# Patient Record
Sex: Male | Born: 1985 | Race: White | Hispanic: No | Marital: Single | Smoking: Never smoker
Health system: Southern US, Community
[De-identification: ages and names within clinical notes are randomized; demographics above are authoritative.]

## PROBLEM LIST (undated history)

## (undated) DIAGNOSIS — N200 Calculus of kidney: Secondary | ICD-10-CM

## (undated) HISTORY — PX: STONE EXTRACTION WITH BASKET: SHX5318

---

## 2008-04-09 ENCOUNTER — Ambulatory Visit: Payer: Self-pay | Admitting: Family Medicine

## 2008-04-09 DIAGNOSIS — N200 Calculus of kidney: Secondary | ICD-10-CM | POA: Insufficient documentation

## 2008-04-09 DIAGNOSIS — Q638 Other specified congenital malformations of kidney: Secondary | ICD-10-CM | POA: Insufficient documentation

## 2008-04-16 ENCOUNTER — Telehealth (INDEPENDENT_AMBULATORY_CARE_PROVIDER_SITE_OTHER): Payer: Self-pay | Admitting: *Deleted

## 2008-04-16 ENCOUNTER — Ambulatory Visit (HOSPITAL_COMMUNITY): Admission: RE | Admit: 2008-04-16 | Discharge: 2008-04-16 | Payer: Self-pay | Admitting: Family Medicine

## 2008-04-16 ENCOUNTER — Ambulatory Visit: Payer: Self-pay | Admitting: Family Medicine

## 2008-04-29 ENCOUNTER — Encounter (INDEPENDENT_AMBULATORY_CARE_PROVIDER_SITE_OTHER): Payer: Self-pay | Admitting: Family Medicine

## 2008-05-03 ENCOUNTER — Encounter (INDEPENDENT_AMBULATORY_CARE_PROVIDER_SITE_OTHER): Payer: Self-pay | Admitting: Family Medicine

## 2008-12-14 ENCOUNTER — Emergency Department (HOSPITAL_COMMUNITY): Admission: EM | Admit: 2008-12-14 | Discharge: 2008-12-14 | Payer: Self-pay | Admitting: Emergency Medicine

## 2008-12-25 ENCOUNTER — Telehealth (INDEPENDENT_AMBULATORY_CARE_PROVIDER_SITE_OTHER): Payer: Self-pay | Admitting: *Deleted

## 2009-01-01 ENCOUNTER — Emergency Department (HOSPITAL_COMMUNITY): Admission: EM | Admit: 2009-01-01 | Discharge: 2009-01-01 | Payer: Self-pay | Admitting: Emergency Medicine

## 2009-01-09 ENCOUNTER — Ambulatory Visit: Payer: Self-pay | Admitting: Family Medicine

## 2009-01-09 DIAGNOSIS — R109 Unspecified abdominal pain: Secondary | ICD-10-CM

## 2009-01-22 ENCOUNTER — Ambulatory Visit: Payer: Self-pay | Admitting: Family Medicine

## 2011-12-10 ENCOUNTER — Emergency Department (HOSPITAL_COMMUNITY): Payer: BC Managed Care – PPO

## 2011-12-10 ENCOUNTER — Encounter (HOSPITAL_COMMUNITY): Payer: Self-pay

## 2011-12-10 ENCOUNTER — Emergency Department (HOSPITAL_COMMUNITY)
Admission: EM | Admit: 2011-12-10 | Discharge: 2011-12-10 | Disposition: A | Discharge status: Home / Self Care | Payer: BC Managed Care – PPO | Attending: Emergency Medicine | Admitting: Emergency Medicine

## 2011-12-10 DIAGNOSIS — M436 Torticollis: Secondary | ICD-10-CM | POA: Insufficient documentation

## 2011-12-10 HISTORY — DX: Calculus of kidney: N20.0

## 2011-12-10 MED ORDER — NAPROXEN 500 MG PO TABS: 500.0000 mg | ORAL_TABLET | Freq: Two times a day (BID) | ORAL | Status: AC

## 2011-12-10 MED ORDER — IBUPROFEN 800 MG PO TABS
800.0000 mg | ORAL_TABLET | Freq: Once | ORAL | Status: AC
  Administered 2011-12-10: 800 mg via ORAL
  Filled 2011-12-10: qty 1

## 2011-12-10 MED ORDER — CYCLOBENZAPRINE HCL 10 MG PO TABS: 10.0000 mg | ORAL_TABLET | Freq: Three times a day (TID) | ORAL | Status: AC | PRN

## 2011-12-10 MED ORDER — OXYCODONE-ACETAMINOPHEN 5-325 MG PO TABS
2.0000 | ORAL_TABLET | Freq: Once | ORAL | Status: AC
  Administered 2011-12-10: 2 via ORAL
  Filled 2011-12-10: qty 2

## 2011-12-10 MED ORDER — OXYCODONE-ACETAMINOPHEN 5-325 MG PO TABS: 1.0000 | ORAL_TABLET | Freq: Four times a day (QID) | ORAL | Status: AC | PRN

## 2011-12-10 NOTE — ED Notes (Signed)
Pt presents with neck pain that began last pm. C/o sharp pain that radiates down back.

## 2011-12-10 NOTE — ED Provider Notes (Signed)
History     CSN: [id number]  Arrival date & time 12/10/11  1637   First MD Initiated Contact with Patient 12/10/11 1703      Chief Complaint  Patient presents with  . Neck Pain    (Consider location/radiation/quality/duration/timing/severity/associated sxs/prior treatment) Patient is a 26 y.o. male presenting with neck pain. The history is provided by the patient. No language interpreter was used.  Neck Pain  This is a new problem. The current episode started yesterday. The problem occurs constantly. The problem has been gradually worsening. The pain is associated with nothing. There has been no fever. The pain is present in the generalized neck. The quality of the pain is described as aching. The pain radiates to the left shoulder and right shoulder. The pain is moderate. The symptoms are aggravated by bending and position. The pain is the same all the time. Pertinent negatives include no photophobia, no visual change, no chest pain, no syncope, no numbness, no weight loss, no headaches, no bowel incontinence, no bladder incontinence, no paresis and no weakness. He has tried nothing for the symptoms.    Past Medical History  Diagnosis Date  . Kidney stones     Past Surgical History  Procedure Date  . Stone extraction with basket     No family history on file.  History  Substance Use Topics  . Smoking status: Never Smoker   . Smokeless tobacco: Not on file  . Alcohol Use: Yes      Review of Systems  Constitutional: Negative for fever, weight loss, activity change, appetite change and fatigue.  HENT: Positive for neck pain. Negative for congestion, sore throat, rhinorrhea and neck stiffness.   Eyes: Negative for photophobia.  Respiratory: Negative for cough and shortness of breath.   Cardiovascular: Negative for chest pain, palpitations and syncope.  Gastrointestinal: Negative for nausea, vomiting, abdominal pain and bowel incontinence.  Genitourinary: Negative for  bladder incontinence, dysuria, urgency, frequency and flank pain.  Musculoskeletal: Negative for myalgias, back pain and arthralgias.  Neurological: Negative for dizziness, weakness, light-headedness, numbness and headaches.  All other systems reviewed and are negative.    Allergies  Review of patient's allergies indicates no known allergies.  Home Medications   Current Outpatient Rx  Name Route Sig Dispense Refill  . ACETAMINOPHEN 500 MG PO TABS Oral Take 1,000 mg by mouth once as needed.    . MUSCLE RUB 10-15 % EX CREA Topical Apply 1 application topically as needed.    . CYCLOBENZAPRINE HCL 10 MG PO TABS Oral Take 1 tablet (10 mg total) by mouth 3 (three) times daily as needed for muscle spasms. 30 tablet 0  . NAPROXEN 500 MG PO TABS Oral Take 1 tablet (500 mg total) by mouth 2 (two) times daily. 30 tablet 0  . OXYCODONE-ACETAMINOPHEN 5-325 MG PO TABS Oral Take 1-2 tablets by mouth every 6 (six) hours as needed for pain. 20 tablet 0    BP 149/79  Pulse 93  Temp(Src) 99 F (37.2 C) (Oral)  Resp 20  Ht 6\' 3"  (1.905 m)  Wt 190 lb (86.183 kg)  BMI 23.75 kg/m2  SpO2 100%  Physical Exam  Nursing note and vitals reviewed. Constitutional: He is oriented to person, place, and time. He appears well-developed and well-nourished. No distress.  HENT:  Head: Normocephalic and atraumatic.  Mouth/Throat: Oropharynx is clear and moist.  Eyes: Conjunctivae and EOM are normal. Pupils are equal, round, and reactive to light.  Neck: Neck supple.  Limited range of motion secondary to pain. He has no midline tenderness to palpation. Pain is in the paraspinal and lateral musculature.  Cardiovascular: Normal rate, regular rhythm, normal heart sounds and intact distal pulses.  Exam reveals no gallop and no friction rub.   No murmur heard. Pulmonary/Chest: Effort normal and breath sounds normal. No respiratory distress.  Abdominal: Soft. Bowel sounds are normal. There is no tenderness.    Musculoskeletal: Normal range of motion. He exhibits no tenderness.  Lymphadenopathy:    He has no cervical adenopathy.  Neurological: He is alert and oriented to person, place, and time. He has normal strength and normal reflexes. No cranial nerve deficit or sensory deficit.  Skin: Skin is warm and dry. No rash noted.    ED Course  Procedures (including critical care time)  Labs Reviewed - No data to display Dg Cervical Spine Complete  12/10/2011  *RADIOLOGY REPORT*  Clinical Data: Neck pain.  CERVICAL SPINE - COMPLETE 4+ VIEW  Comparison: No priors.  Findings: No acute displaced fractures of the cervical spine are noted.  Alignment is anatomic.  Prevertebral soft tissues are normal.  No significant degenerative changes are appreciated.  IMPRESSION: 1.  No acute radiographic abnormality of the cervical spine to account for patient's symptoms.  Original Report Authenticated By: Florencia Reasons, M.D.     1. Torticollis       MDM  Torticollis. Encouraged ice and heat at home. Will be placed on anti-inflammatory, pain medication, muscle relaxant. Instructed to followup with his primary care physician. Provided strict signs and symptoms for which to return        Dayton Bailiff, MD 12/10/11 1758

## 2011-12-10 NOTE — ED Notes (Signed)
MD at bedside. 

## 2011-12-10 NOTE — Discharge Instructions (Signed)
Torticollis, Acute You have suddenly (acutely) developed a twisted neck (torticollis). This is usually a self-limited condition. CAUSES  Acute torticollis may be caused by malposition, trauma or infection. Most commonly, acute torticollis is caused by sleeping in an awkward position. Torticollis may also be caused by the flexion, extension or twisting of the neck muscles beyond their normal position. Sometimes, the exact cause may not be known. SYMPTOMS  Usually, there is pain and limited movement of the neck. Your neck may twist to one side. DIAGNOSIS  The diagnosis is often made by physical examination. X-rays, CT scans or MRIs may be done if there is a history of trauma or concern of infection. TREATMENT  For a common, stiff neck that develops during sleep, treatment is focused on relaxing the contracted neck muscle. Medications (including shots) may be used to treat the problem. Most cases resolve in several days. Torticollis usually responds to conservative physical therapy. If left untreated, the shortened and spastic neck muscle can cause deformities in the face and neck. Rarely, surgery is required. HOME CARE INSTRUCTIONS   Use over-the-counter and prescription medications as directed by your caregiver.   Do stretching exercises and massage the neck as directed by your caregiver.   Follow up with physical therapy if needed and as directed by your caregiver.  SEEK IMMEDIATE MEDICAL CARE IF:   You develop difficulty breathing or noisy breathing (stridor).   You drool, develop trouble swallowing or have pain with swallowing.   You develop numbness or weakness in the hands or feet.   You have changes in speech or vision.   You have problems with urination or bowel movements.   You have difficulty walking.   You have a fever.   You have increased pain.  MAKE SURE YOU:   Understand these instructions.   Will watch your condition.   Will get help right away if you are not  doing well or get worse.  Document Released: 08/27/2000 Document Revised: 08/19/2011 Document Reviewed: 10/08/2009 [employer] Patient Information 2012 [employer]. 

## 2013-04-25 ENCOUNTER — Other Ambulatory Visit: Payer: Self-pay | Admitting: Neurosurgery

## 2013-04-25 DIAGNOSIS — M47812 Spondylosis without myelopathy or radiculopathy, cervical region: Secondary | ICD-10-CM

## 2013-04-28 ENCOUNTER — Emergency Department (HOSPITAL_COMMUNITY)
Admission: EM | Admit: 2013-04-28 | Discharge: 2013-04-28 | Disposition: A | Discharge status: Home / Self Care | Payer: BC Managed Care – PPO | Attending: Emergency Medicine | Admitting: Emergency Medicine

## 2013-04-28 ENCOUNTER — Emergency Department (HOSPITAL_COMMUNITY): Payer: BC Managed Care – PPO

## 2013-04-28 ENCOUNTER — Encounter (HOSPITAL_COMMUNITY): Payer: Self-pay | Admitting: *Deleted

## 2013-04-28 DIAGNOSIS — Y939 Activity, unspecified: Secondary | ICD-10-CM | POA: Insufficient documentation

## 2013-04-28 DIAGNOSIS — S301XXA Contusion of abdominal wall, initial encounter: Secondary | ICD-10-CM

## 2013-04-28 DIAGNOSIS — W1789XA Other fall from one level to another, initial encounter: Secondary | ICD-10-CM | POA: Insufficient documentation

## 2013-04-28 DIAGNOSIS — S199XXA Unspecified injury of neck, initial encounter: Secondary | ICD-10-CM

## 2013-04-28 DIAGNOSIS — Y929 Unspecified place or not applicable: Secondary | ICD-10-CM | POA: Insufficient documentation

## 2013-04-28 DIAGNOSIS — S0990XA Unspecified injury of head, initial encounter: Secondary | ICD-10-CM | POA: Insufficient documentation

## 2013-04-28 DIAGNOSIS — Z87442 Personal history of urinary calculi: Secondary | ICD-10-CM | POA: Insufficient documentation

## 2013-04-28 DIAGNOSIS — S0993XA Unspecified injury of face, initial encounter: Secondary | ICD-10-CM | POA: Insufficient documentation

## 2013-04-28 LAB — URINALYSIS, ROUTINE W REFLEX MICROSCOPIC
Hgb urine dipstick: NEGATIVE
Leukocytes, UA: NEGATIVE
Nitrite: NEGATIVE
Protein, ur: NEGATIVE mg/dL
Specific Gravity, Urine: 1.02 (ref 1.005–1.030)
Urobilinogen, UA: 0.2 mg/dL (ref 0.0–1.0)

## 2013-04-28 MED ORDER — IOHEXOL 300 MG/ML  SOLN
100.0000 mL | Freq: Once | INTRAMUSCULAR | Status: AC | PRN
  Administered 2013-04-28: 100 mL via INTRAVENOUS

## 2013-04-28 MED ORDER — HYDROMORPHONE HCL PF 1 MG/ML IJ SOLN
INTRAMUSCULAR | Status: AC
  Administered 2013-04-28: 1 mg
  Filled 2013-04-28: qty 1

## 2013-04-28 MED ORDER — OXYCODONE-ACETAMINOPHEN 5-325 MG PO TABS: 1.0000 | ORAL_TABLET | Freq: Four times a day (QID) | ORAL | Status: DC | PRN
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
## 2013-04-28 NOTE — ED Notes (Signed)
Abrasions noted to left upper back and flank area

## 2013-04-29 ENCOUNTER — Ambulatory Visit
Admission: RE | Admit: 2013-04-29 | Discharge: 2013-04-29 | Disposition: A | Discharge status: Home / Self Care | Payer: BC Managed Care – PPO | Source: Ambulatory Visit | Attending: Neurosurgery | Admitting: Neurosurgery

## 2013-04-29 DIAGNOSIS — M47812 Spondylosis without myelopathy or radiculopathy, cervical region: Secondary | ICD-10-CM

## 2013-07-19 ENCOUNTER — Other Ambulatory Visit: Payer: Self-pay

## 2016-05-12 ENCOUNTER — Other Ambulatory Visit: Payer: Self-pay | Admitting: Nurse Practitioner

## 2016-05-12 DIAGNOSIS — M5020 Other cervical disc displacement, unspecified cervical region: Secondary | ICD-10-CM

## 2016-05-12 DIAGNOSIS — M47812 Spondylosis without myelopathy or radiculopathy, cervical region: Secondary | ICD-10-CM

## 2016-05-22 ENCOUNTER — Ambulatory Visit
Admission: RE | Admit: 2016-05-22 | Discharge: 2016-05-22 | Disposition: A | Discharge status: Home / Self Care | Payer: Self-pay | Source: Ambulatory Visit | Attending: Nurse Practitioner | Admitting: Nurse Practitioner

## 2016-05-22 DIAGNOSIS — M5020 Other cervical disc displacement, unspecified cervical region: Secondary | ICD-10-CM

## 2016-05-22 DIAGNOSIS — M47812 Spondylosis without myelopathy or radiculopathy, cervical region: Secondary | ICD-10-CM

## 2016-06-10 ENCOUNTER — Other Ambulatory Visit: Payer: Self-pay | Admitting: Nurse Practitioner

## 2016-06-10 DIAGNOSIS — M5020 Other cervical disc displacement, unspecified cervical region: Secondary | ICD-10-CM

## 2016-06-10 DIAGNOSIS — M47812 Spondylosis without myelopathy or radiculopathy, cervical region: Secondary | ICD-10-CM

## 2016-07-08 ENCOUNTER — Ambulatory Visit
Admission: RE | Admit: 2016-07-08 | Discharge: 2016-07-08 | Disposition: A | Discharge status: Home / Self Care | Payer: BLUE CROSS/BLUE SHIELD | Source: Ambulatory Visit | Attending: Nurse Practitioner | Admitting: Nurse Practitioner

## 2016-07-08 DIAGNOSIS — M5020 Other cervical disc displacement, unspecified cervical region: Secondary | ICD-10-CM

## 2016-07-08 DIAGNOSIS — M47812 Spondylosis without myelopathy or radiculopathy, cervical region: Secondary | ICD-10-CM

## 2016-07-08 MED ORDER — IOPAMIDOL (ISOVUE-M 300) INJECTION 61%
1.0000 mL | Freq: Once | INTRAMUSCULAR | Status: AC | PRN
  Administered 2016-07-08: 1 mL via EPIDURAL

## 2016-07-08 MED ORDER — TRIAMCINOLONE ACETONIDE 40 MG/ML IJ SUSP (RADIOLOGY)
60.0000 mg | Freq: Once | INTRAMUSCULAR | Status: AC
  Administered 2016-07-08: 60 mg via EPIDURAL

## 2016-07-08 NOTE — Discharge Instructions (Signed)

## 2016-07-09 ENCOUNTER — Inpatient Hospital Stay: Admission: RE | Admit: 2016-07-09 | Payer: Self-pay | Source: Ambulatory Visit

## 2016-08-17 ENCOUNTER — Other Ambulatory Visit: Payer: Self-pay | Admitting: Nurse Practitioner

## 2016-08-17 DIAGNOSIS — M5020 Other cervical disc displacement, unspecified cervical region: Secondary | ICD-10-CM

## 2016-09-24 ENCOUNTER — Ambulatory Visit
Admission: RE | Admit: 2016-09-24 | Discharge: 2016-09-24 | Disposition: A | Discharge status: Home / Self Care | Payer: BLUE CROSS/BLUE SHIELD | Source: Ambulatory Visit | Attending: Nurse Practitioner | Admitting: Nurse Practitioner

## 2016-09-24 DIAGNOSIS — M5020 Other cervical disc displacement, unspecified cervical region: Secondary | ICD-10-CM

## 2016-09-24 MED ORDER — TRIAMCINOLONE ACETONIDE 40 MG/ML IJ SUSP (RADIOLOGY)
60.0000 mg | Freq: Once | INTRAMUSCULAR | Status: AC
  Administered 2016-09-24: 60 mg via EPIDURAL

## 2016-09-24 MED ORDER — HYDROCODONE-ACETAMINOPHEN 5-325 MG PO TABS
2.0000 | ORAL_TABLET | Freq: Once | ORAL | Status: AC
  Administered 2016-09-24: 2 via ORAL

## 2016-09-24 MED ORDER — IOPAMIDOL (ISOVUE-M 300) INJECTION 61%
1.0000 mL | Freq: Once | INTRAMUSCULAR | Status: AC | PRN
Start: 2016-09-24 — End: 2016-09-24
  Administered 2016-09-24: 1 mL via EPIDURAL

## 2016-09-24 NOTE — Discharge Instructions (Signed)

## 2017-02-01 ENCOUNTER — Other Ambulatory Visit: Payer: Self-pay | Admitting: Nurse Practitioner

## 2017-02-01 DIAGNOSIS — M545 Low back pain, unspecified: Secondary | ICD-10-CM

## 2017-02-19 ENCOUNTER — Ambulatory Visit
Admission: RE | Admit: 2017-02-19 | Discharge: 2017-02-19 | Disposition: A | Discharge status: Home / Self Care | Payer: BLUE CROSS/BLUE SHIELD | Source: Ambulatory Visit | Attending: Nurse Practitioner | Admitting: Nurse Practitioner

## 2017-02-19 DIAGNOSIS — M545 Low back pain, unspecified: Secondary | ICD-10-CM

## 2017-02-25 ENCOUNTER — Other Ambulatory Visit: Payer: Self-pay | Admitting: Nurse Practitioner

## 2017-02-25 DIAGNOSIS — M5126 Other intervertebral disc displacement, lumbar region: Secondary | ICD-10-CM

## 2017-03-04 ENCOUNTER — Inpatient Hospital Stay
Admission: RE | Admit: 2017-03-04 | Discharge: 2017-03-04 | Disposition: A | Discharge status: Home / Self Care | Payer: PRIVATE HEALTH INSURANCE | Source: Ambulatory Visit | Attending: Nurse Practitioner | Admitting: Nurse Practitioner

## 2017-04-19 ENCOUNTER — Ambulatory Visit
Admission: RE | Admit: 2017-04-19 | Discharge: 2017-04-19 | Disposition: A | Discharge status: Home / Self Care | Payer: PRIVATE HEALTH INSURANCE | Source: Ambulatory Visit | Attending: Nurse Practitioner | Admitting: Nurse Practitioner

## 2017-04-19 DIAGNOSIS — M5126 Other intervertebral disc displacement, lumbar region: Secondary | ICD-10-CM

## 2017-04-19 MED ORDER — IOPAMIDOL (ISOVUE-M 200) INJECTION 41%
1.0000 mL | Freq: Once | INTRAMUSCULAR | Status: AC
  Administered 2017-04-19: 1 mL via EPIDURAL

## 2017-04-19 MED ORDER — METHYLPREDNISOLONE ACETATE 40 MG/ML INJ SUSP (RADIOLOG
120.0000 mg | Freq: Once | INTRAMUSCULAR | Status: AC
  Administered 2017-04-19: 120 mg via EPIDURAL

## 2017-04-19 NOTE — Discharge Instructions (Signed)

## 2017-07-28 ENCOUNTER — Other Ambulatory Visit: Payer: Self-pay | Admitting: Neurosurgery

## 2017-07-28 DIAGNOSIS — M5020 Other cervical disc displacement, unspecified cervical region: Secondary | ICD-10-CM

## 2017-08-07 ENCOUNTER — Ambulatory Visit
Admission: RE | Admit: 2017-08-07 | Discharge: 2017-08-07 | Disposition: A | Discharge status: Home / Self Care | Payer: PRIVATE HEALTH INSURANCE | Source: Ambulatory Visit | Attending: Neurosurgery | Admitting: Neurosurgery

## 2017-08-07 DIAGNOSIS — M5020 Other cervical disc displacement, unspecified cervical region: Secondary | ICD-10-CM

## 2017-08-15 ENCOUNTER — Other Ambulatory Visit: Payer: Self-pay | Admitting: Neurosurgery

## 2017-08-15 DIAGNOSIS — M5020 Other cervical disc displacement, unspecified cervical region: Secondary | ICD-10-CM

## 2017-08-25 ENCOUNTER — Inpatient Hospital Stay: Admission: RE | Admit: 2017-08-25 | Payer: PRIVATE HEALTH INSURANCE | Source: Ambulatory Visit

## 2017-09-08 ENCOUNTER — Ambulatory Visit
Admission: RE | Admit: 2017-09-08 | Discharge: 2017-09-08 | Disposition: A | Discharge status: Home / Self Care | Payer: PRIVATE HEALTH INSURANCE | Source: Ambulatory Visit | Attending: Neurosurgery | Admitting: Neurosurgery

## 2017-09-08 DIAGNOSIS — M5020 Other cervical disc displacement, unspecified cervical region: Secondary | ICD-10-CM

## 2017-09-08 MED ORDER — HYDROCODONE-ACETAMINOPHEN 5-325 MG PO TABS
2.0000 | ORAL_TABLET | Freq: Once | ORAL | Status: AC
  Administered 2017-09-08: 2 via ORAL

## 2017-09-08 MED ORDER — IOPAMIDOL (ISOVUE-M 300) INJECTION 61%
1.0000 mL | Freq: Once | INTRAMUSCULAR | Status: AC | PRN
  Administered 2017-09-08: 1 mL via EPIDURAL

## 2017-09-08 MED ORDER — TRIAMCINOLONE ACETONIDE 40 MG/ML IJ SUSP (RADIOLOGY)
60.0000 mg | Freq: Once | INTRAMUSCULAR | Status: AC
  Administered 2017-09-08: 60 mg via EPIDURAL

## 2018-01-08 IMAGING — MR MR CERVICAL SPINE W/O CM
5 series · 28 of 48 positions shown · non-contrast
Comparison: 04/29/2013

CLINICAL DATA: Neck pain for 3 years. Getting increasingly worse
over the past few months. Radiates down bilateral arms.

EXAM:
MRI CERVICAL SPINE WITHOUT CONTRAST
TECHNIQUE: Multiplanar, multisequence MR imaging of the cervical spine was
performed. No intravenous contrast was administered.

[Series 6: T1 · sagittal · 3.0mm · 0.66mm/px · 6 of 15 slices shown]
[im 1/15]
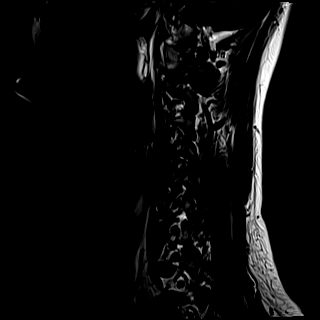
[im 3/15]
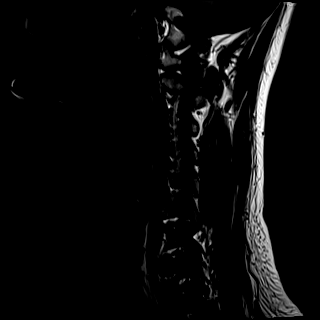
[im 6/15]
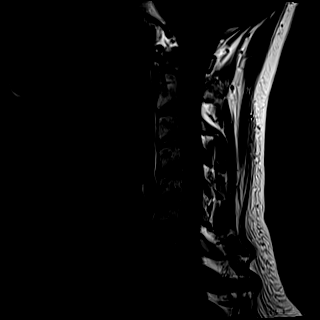
[im 9/15]
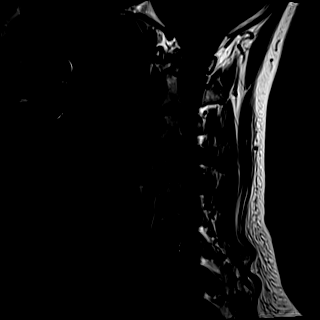
[im 12/15]
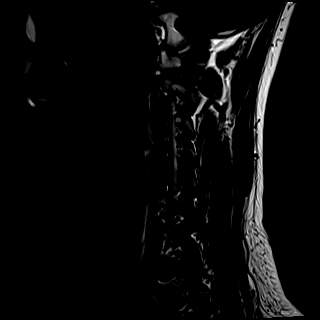
[im 15/15]
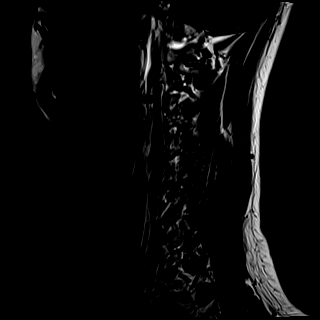

[Series 7: T2 · sagittal · 3.0mm · 0.55mm/px · 6 of 15 slices shown (1 of 2)]
[im 1/15]
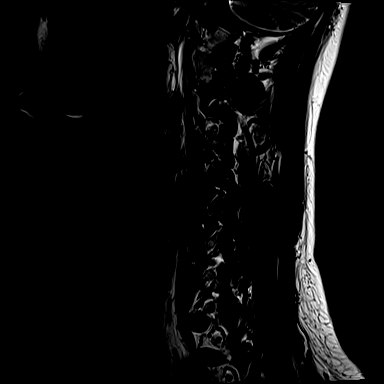
[im 3/15]
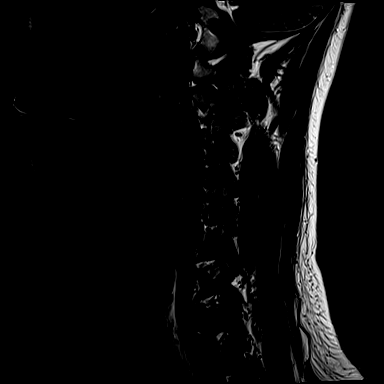
[im 6/15]
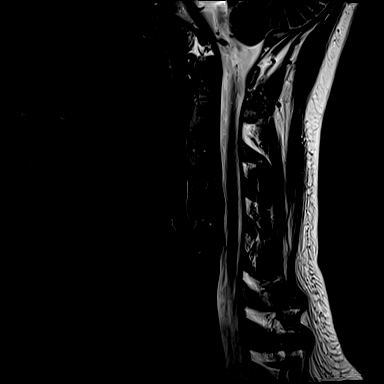
[im 9/15]
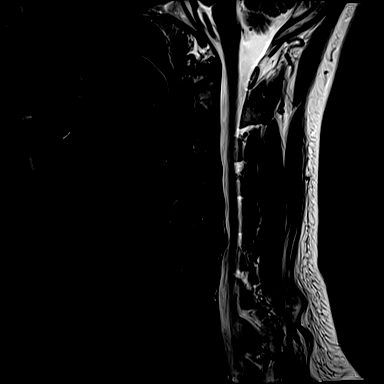
[im 12/15]
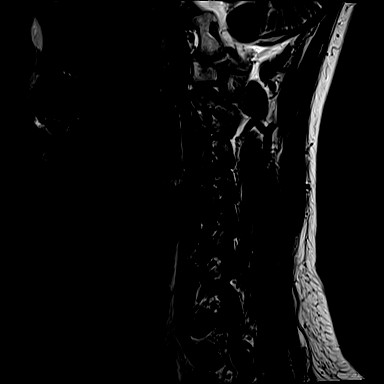
[im 15/15]
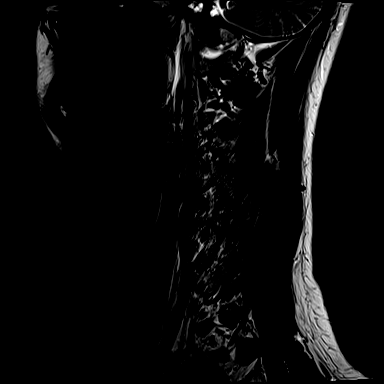

[Series 8: T2 · axial · 3.0mm · 0.50mm/px · z∈[-81,+36]mm · 9 of 37 slices shown (2 of 2)]
[im 1/37]
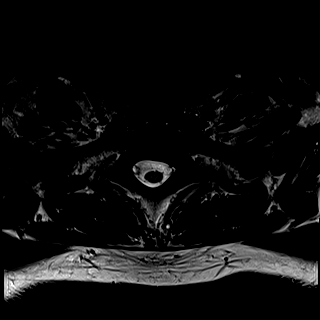
[im 6/37]
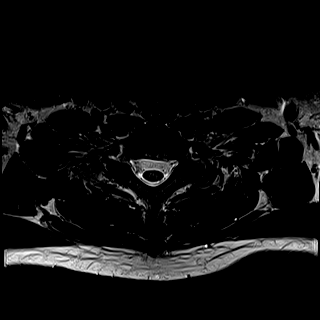
[im 11/37]
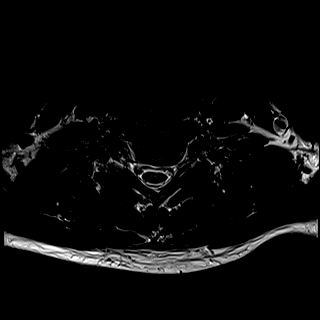
[im 16/37]
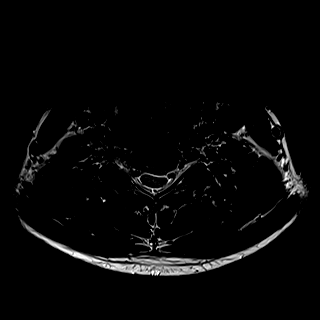
[im 19/37]
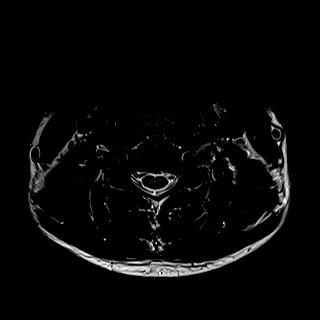
[im 21/37]
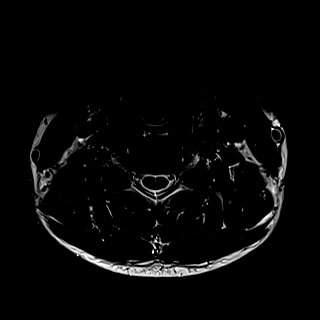
[im 26/37]
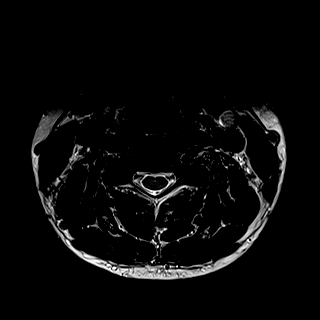
[im 31/37]
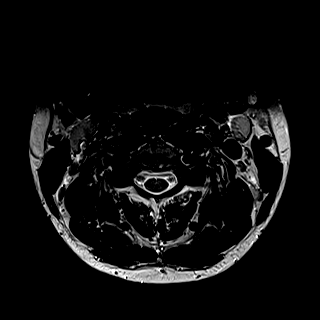
[im 37/37]
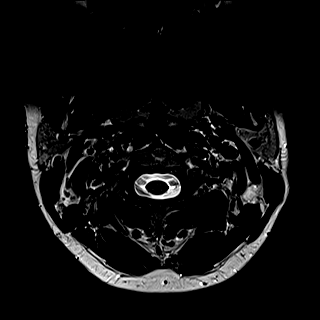

[Series 9: STIR · sagittal · 3.0mm · 0.33mm/px · 6 of 15 slices shown]
[im 1/15]
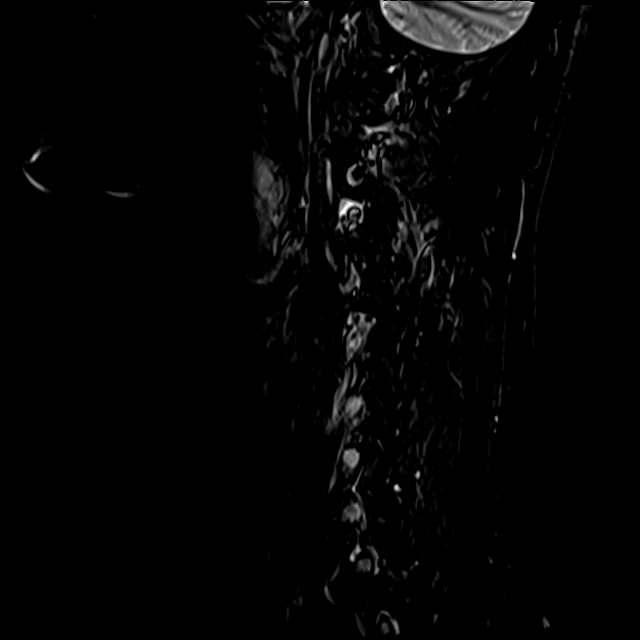
[im 3/15]
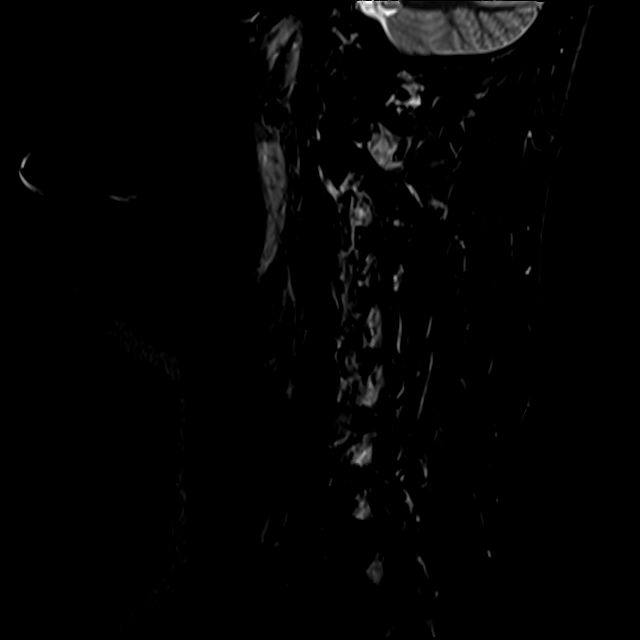
[im 6/15]
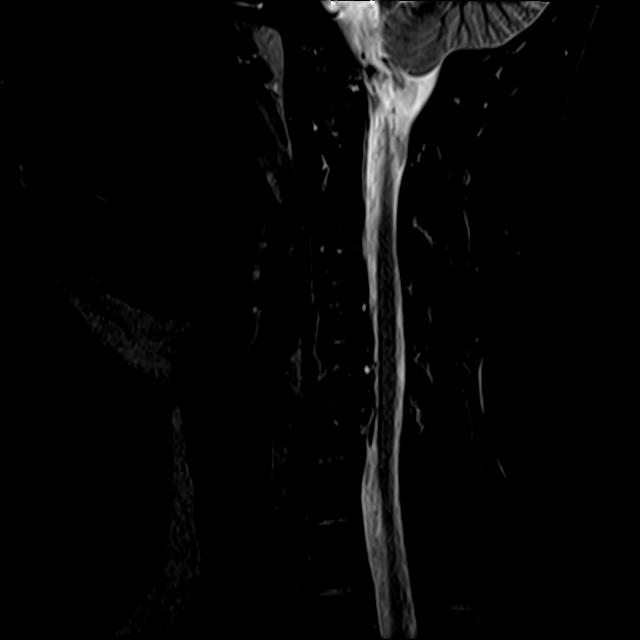
[im 9/15]
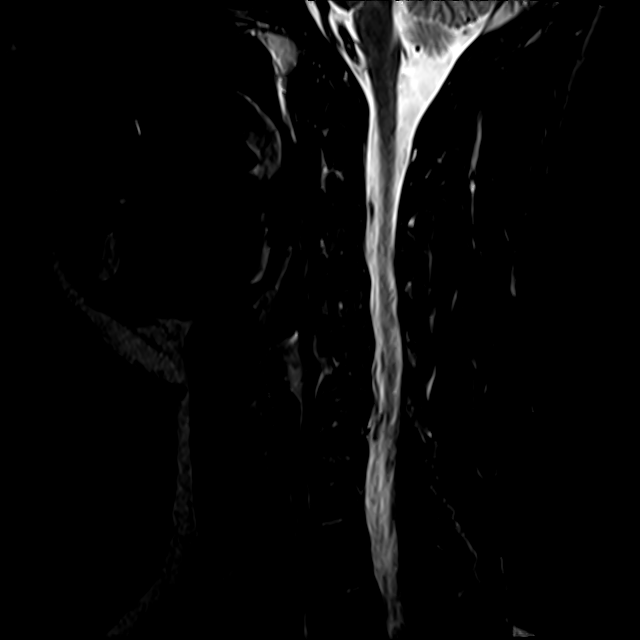
[im 12/15]
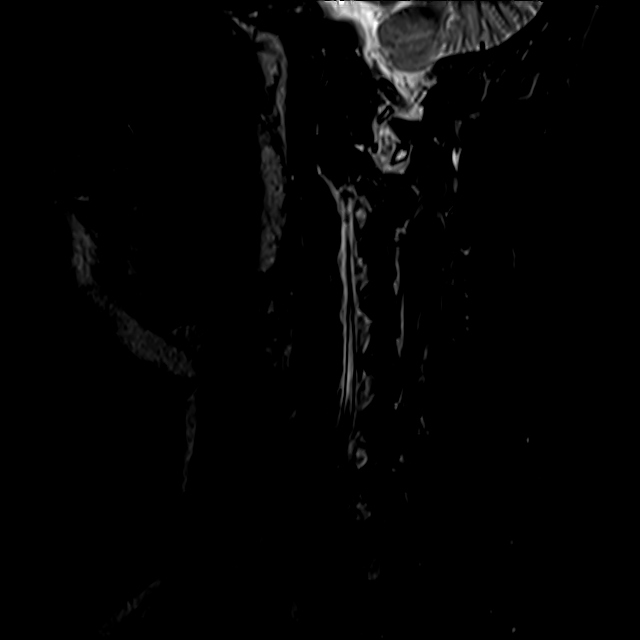
[im 15/15]
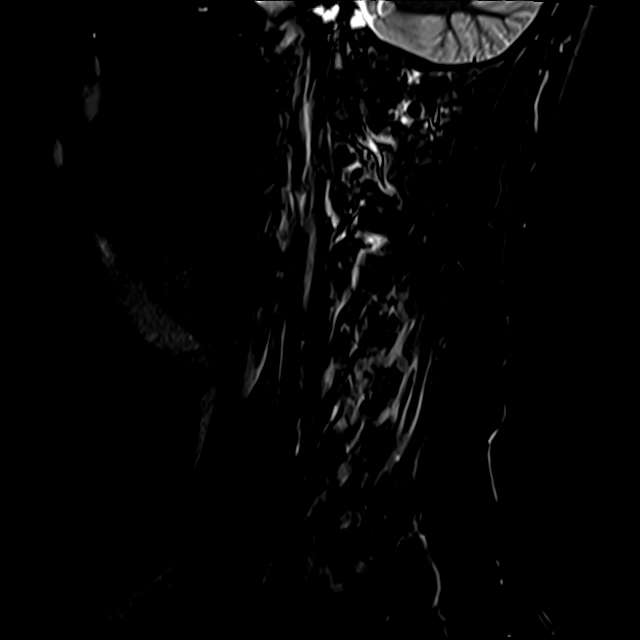

[Series 10: GRE · axial · 3.0mm · 0.42mm/px · 1 of 37 slices shown]
[im 1/37]
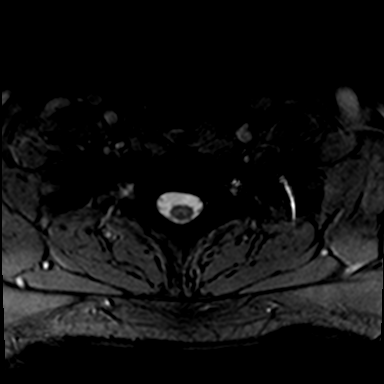

[28 of 48 positions shown; findings below may reference images not displayed]

FINDINGS: Alignment: Physiologic.

Vertebrae: No fracture, evidence of discitis, or bone lesion.

Cord: Normal signal and morphology.

Posterior Fossa, vertebral arteries, paraspinal tissues: Negative.

Disc levels:

Discs: Mild disc desiccation at C5-6.

C2-3: No significant disc bulge. No neural foraminal stenosis. No
central canal stenosis.

C3-4: No significant disc bulge. No neural foraminal stenosis. No
central canal stenosis.

C4-5: No significant disc bulge. No neural foraminal stenosis. No
central canal stenosis.

C5-6: Left paracentral disc protrusion contacting the ventral right
paracentral cervical spinal cord. No neural foraminal stenosis. No
central canal stenosis.

C6-7: No significant disc bulge. No neural foraminal stenosis. No
central canal stenosis.

C7-T1: No significant disc bulge. No neural foraminal stenosis. No
central canal stenosis.
IMPRESSION: 1. At C5-6 there is a left paracentral disc protrusion contacting
the ventral right paracentral cervical spinal cord, which is larger
compared with the prior exam of 04/29/2013.

## 2018-04-29 ENCOUNTER — Other Ambulatory Visit: Payer: Self-pay | Admitting: Nurse Practitioner

## 2018-04-29 DIAGNOSIS — M5020 Other cervical disc displacement, unspecified cervical region: Secondary | ICD-10-CM

## 2018-05-14 ENCOUNTER — Ambulatory Visit
Admission: RE | Admit: 2018-05-14 | Discharge: 2018-05-14 | Disposition: A | Discharge status: Home / Self Care | Payer: PRIVATE HEALTH INSURANCE | Source: Ambulatory Visit | Attending: Nurse Practitioner | Admitting: Nurse Practitioner

## 2018-05-14 DIAGNOSIS — M5020 Other cervical disc displacement, unspecified cervical region: Secondary | ICD-10-CM

## 2019-05-02 ENCOUNTER — Other Ambulatory Visit: Payer: Self-pay | Admitting: Neurology

## 2019-05-02 ENCOUNTER — Other Ambulatory Visit (HOSPITAL_COMMUNITY): Payer: Self-pay | Admitting: Neurology

## 2019-05-02 DIAGNOSIS — R519 Headache, unspecified: Secondary | ICD-10-CM

## 2019-05-02 DIAGNOSIS — R200 Anesthesia of skin: Secondary | ICD-10-CM

## 2019-05-08 ENCOUNTER — Other Ambulatory Visit: Payer: Self-pay

## 2019-05-08 ENCOUNTER — Ambulatory Visit (HOSPITAL_COMMUNITY)
Admission: RE | Admit: 2019-05-08 | Discharge: 2019-05-08 | Disposition: A | Discharge status: Home / Self Care | Payer: PRIVATE HEALTH INSURANCE | Source: Ambulatory Visit | Attending: Neurology | Admitting: Neurology

## 2019-05-08 DIAGNOSIS — R51 Headache: Secondary | ICD-10-CM | POA: Insufficient documentation

## 2019-05-08 DIAGNOSIS — R200 Anesthesia of skin: Secondary | ICD-10-CM | POA: Insufficient documentation

## 2019-05-08 DIAGNOSIS — R519 Headache, unspecified: Secondary | ICD-10-CM
